# Patient Record
Sex: Female | Born: 1940 | Race: White | Hispanic: No | Marital: Married | Smoking: Never smoker
Health system: Southern US, Community
[De-identification: ages and names within clinical notes are randomized; demographics above are authoritative.]

## PROBLEM LIST (undated history)

## (undated) DIAGNOSIS — Z85828 Personal history of other malignant neoplasm of skin: Secondary | ICD-10-CM

## (undated) HISTORY — PX: NOSE SURGERY: SHX723

## (undated) HISTORY — PX: SKIN BIOPSY: SHX1

## (undated) HISTORY — PX: DILATION AND CURETTAGE OF UTERUS: SHX78

---

## 2010-10-29 ENCOUNTER — Ambulatory Visit: Payer: Self-pay | Admitting: Internal Medicine

## 2010-11-03 ENCOUNTER — Ambulatory Visit: Payer: Self-pay

## 2010-11-19 ENCOUNTER — Ambulatory Visit: Payer: Self-pay

## 2014-06-30 ENCOUNTER — Ambulatory Visit: Payer: Medicare Other

## 2014-06-30 ENCOUNTER — Ambulatory Visit
Admission: EM | Admit: 2014-06-30 | Discharge: 2014-06-30 | Disposition: A | Discharge status: Home / Self Care | Payer: Medicare Other | Attending: Family Medicine | Admitting: Family Medicine

## 2014-06-30 DIAGNOSIS — Z79899 Other long term (current) drug therapy: Secondary | ICD-10-CM | POA: Diagnosis not present

## 2014-06-30 DIAGNOSIS — R0781 Pleurodynia: Secondary | ICD-10-CM | POA: Insufficient documentation

## 2014-06-30 DIAGNOSIS — W19XXXA Unspecified fall, initial encounter: Secondary | ICD-10-CM | POA: Diagnosis not present

## 2014-06-30 MED ORDER — HYDROCODONE-ACETAMINOPHEN 5-325 MG PO TABS: 1.0000 | ORAL_TABLET | Freq: Four times a day (QID) | ORAL | Status: AC | PRN

## 2014-06-30 NOTE — Discharge Instructions (Signed)
Chest Wall Pain  Chest wall pain is pain in or around the bones and muscles of your chest. It may take up to 6 weeks to get better. It may take longer if you must stay physically active in your work and activities.   CAUSES   Chest wall pain may happen on its own. However, it may be caused by:   A viral illness like the flu.   Injury.   Coughing.   Exercise.   Arthritis.   Fibromyalgia.   Shingles.  HOME CARE INSTRUCTIONS    Avoid overtiring physical activity. Try not to strain or perform activities that cause pain. This includes any activities using your chest or your abdominal and side muscles, especially if heavy weights are used.   Put ice on the sore area.   Put ice in a plastic bag.   Place a towel between your skin and the bag.   Leave the ice on for 15-20 minutes per hour while awake for the first 2 days.   Only take over-the-counter or prescription medicines for pain, discomfort, or fever as directed by your caregiver.  SEEK IMMEDIATE MEDICAL CARE IF:    Your pain increases, or you are very uncomfortable.   You have a fever.   Your chest pain becomes worse.   You have new, unexplained symptoms.   You have nausea or vomiting.   You feel sweaty or lightheaded.   You have a cough with phlegm (sputum), or you cough up blood.  MAKE SURE YOU:    Understand these instructions.   Will watch your condition.   Will get help right away if you are not doing well or get worse.  Document Released: 01/16/2005 Document Revised: 04/10/2011 Document Reviewed: 09/12/2010  ExitCare Patient Information 2015 [employer]. This information is not intended to replace advice given to you by your health care provider. Make sure you discuss any questions you have with your health care provider.    Fall Prevention and Home Safety  Falls cause injuries and can affect all age groups. It is possible to use preventive measures to significantly decrease the likelihood of falls. There are many simple measures which  can make your home safer and prevent falls.  OUTDOORS   Repair cracks and edges of walkways and driveways.   Remove high doorway thresholds.   Trim shrubbery on the main path into your home.   Have good outside lighting.   Clear walkways of tools, rocks, debris, and clutter.   Check that handrails are not broken and are securely fastened. Both sides of steps should have handrails.   Have leaves, snow, and ice cleared regularly.   Use sand or salt on walkways during winter months.   In the garage, clean up grease or oil spills.  BATHROOM   Install night lights.   Install grab bars by the toilet and in the tub and shower.   Use non-skid mats or decals in the tub or shower.   Place a plastic non-slip stool in the shower to sit on, if needed.   Keep floors dry and clean up all water on the floor immediately.   Remove soap buildup in the tub or shower on a regular basis.   Secure bath mats with non-slip, double-sided rug tape.   Remove throw rugs and tripping hazards from the floors.  BEDROOMS   Install night lights.   Make sure a bedside light is easy to reach.   Do not use oversized bedding.     Keep a telephone by your bedside.   Have a firm chair with side arms to use for getting dressed.   Remove throw rugs and tripping hazards from the floor.  KITCHEN   Keep handles on pots and pans turned toward the center of the stove. Use back burners when possible.   Clean up spills quickly and allow time for drying.   Avoid walking on wet floors.   Avoid hot utensils and knives.   Position shelves so they are not too high or low.   Place commonly used objects within easy reach.   If necessary, use a sturdy step stool with a grab bar when reaching.   Keep electrical cables out of the way.   Do not use floor polish or wax that makes floors slippery. If you must use wax, use non-skid floor wax.   Remove throw rugs and tripping hazards from the floor.  STAIRWAYS   Never leave objects on  stairs.   Place handrails on both sides of stairways and use them. Fix any loose handrails. Make sure handrails on both sides of the stairways are as long as the stairs.   Check carpeting to make sure it is firmly attached along stairs. Make repairs to worn or loose carpet promptly.   Avoid placing throw rugs at the top or bottom of stairways, or properly secure the rug with carpet tape to prevent slippage. Get rid of throw rugs, if possible.   Have an electrician put in a light switch at the top and bottom of the stairs.  OTHER FALL PREVENTION TIPS   Wear low-heel or rubber-soled shoes that are supportive and fit well. Wear closed toe shoes.   When using a stepladder, make sure it is fully opened and both spreaders are firmly locked. Do not climb a closed stepladder.   Add color or contrast paint or tape to grab bars and handrails in your home. Place contrasting color strips on first and last steps.   Learn and use mobility aids as needed. Install an electrical emergency response system.   Turn on lights to avoid dark areas. Replace light bulbs that burn out immediately. Get light switches that glow.   Arrange furniture to create clear pathways. Keep furniture in the same place.   Firmly attach carpet with non-skid or double-sided tape.   Eliminate uneven floor surfaces.   Select a carpet pattern that does not visually hide the edge of steps.   Be aware of all pets.  OTHER HOME SAFETY TIPS   Set the water temperature for 120 F (48.8 C).   Keep emergency numbers on or near the telephone.   Keep smoke detectors on every level of the home and near sleeping areas.  Document Released: 01/06/2002 Document Revised: 07/18/2011 Document Reviewed: 04/07/2011  ExitCare Patient Information 2015 [employer]. This information is not intended to replace advice given to you by your health care provider. Make sure you discuss any questions you have with your health care provider.

## 2014-06-30 NOTE — ED Notes (Signed)
Slipped and fell 3-4 stairs down, landed midway on right side, complaints of right rib pain. No LOC.

## 2014-06-30 NOTE — ED Provider Notes (Signed)
CSN: [id number]     Arrival date & time 06/30/14  1529 History   First MD Initiated Contact with Patient 06/30/14 1625     Chief Complaint  Patient presents with  . Fall   (Consider location/radiation/quality/duration/timing/severity/associated sxs/prior Treatment) HPI 74 year old female presents complaining of pain in the right rib cage. This morning she was walking down the stairs when her feet slipped out from under her and she impacted the stairs with her right rib cage. Since then she has pain in this area that is worse with any movement. It is also slightly exacerbated by deep breathing. She denies any shortness of breath, NVD, numbness, or weakness. No systemic symptoms. No other injury. No history of osteoporosis or osteopenia   No past medical history on file. Past Surgical History  Procedure Laterality Date  . Cesarean section    . Dilation and curettage of uterus    . Nose surgery     No family history on file. History  Substance Use Topics  . Smoking status: Never Smoker   . Smokeless tobacco: Not on file  . Alcohol Use: No   OB History    No data available     Review of Systems  Respiratory: Negative for shortness of breath.   Cardiovascular: Positive for chest pain (right side of rib cage).  All other systems reviewed and are negative.   Allergies  Review of patient's allergies indicates no known allergies.  Home Medications   Prior to Admission medications   Medication Sig Start Date End Date Taking? Authorizing Provider  HYDROcodone-acetaminophen (NORCO/VICODIN) 5-325 MG per tablet Take 1 tablet by mouth every 6 (six) hours as needed. 06/30/14   Adrian BlackwaterZachary H Vedika Dumlao, PA-C   BP 119/71 mmHg  Pulse 56  Temp(Src) 97.7 F (36.5 C) (Oral)  Resp 16  Ht 4\' 11"  (1.499 m)  Wt 110 lb (49.896 kg)  BMI 22.21 kg/m2  SpO2 96% Physical Exam  Constitutional: She is oriented to person, place, and time. Vital signs are normal. She appears well-developed and well-nourished.  No distress.  HENT:  Head: Normocephalic and atraumatic.  Neck: Normal range of motion. Neck supple. No JVD present. No tracheal deviation present.  Cardiovascular: Normal rate, regular rhythm and normal heart sounds.   Pulmonary/Chest: Effort normal and breath sounds normal. No respiratory distress. She exhibits tenderness (right lateral rib cage, lower ribs, anterior axillary line).  Abdominal: Soft. She exhibits no distension and no mass. There is no tenderness. There is no rebound and no guarding.  No hepatosplenomegaly  Musculoskeletal:       Cervical back: Normal.       Thoracic back: Normal.       Lumbar back: Normal.  Neurological: She is alert and oriented to person, place, and time. She has normal strength. Coordination normal.  Skin: Skin is warm and dry. No rash noted. She is not diaphoretic.  Psychiatric: She has a normal mood and affect. Judgment normal.  Nursing note and vitals reviewed.   ED Course  Procedures (including critical care time) Labs Review Labs Reviewed - No data to display  Imaging Review Dg Ribs Unilateral W/chest Right  06/30/2014   CLINICAL DATA:  74 year old female with a history of fall down stairs this morning. Right anterior lower rib pain.  EXAM: RIGHT RIBS AND CHEST - 3+ VIEW  COMPARISON:  None.  FINDINGS: Cardiomediastinal silhouette within normal limits.  No confluent airspace disease, pneumothorax, or pleural effusion.  Questionable irregularity of the posterior ninth rib. No  displaced fracture involving the anterior ribs in the region of the fiducial.  IMPRESSION: No radiographic evidence of acute cardiopulmonary disease.  Questionable nondisplaced fracture of the posterior aspect of the right ninth rib. Correlation with point tenderness may be useful.  No displaced fracture of anterior lower ribs in the region of the fiducial.  Signed,  Yvone Neu. Loreta Ave, DO  [facility]  [facility]   Electronically  Signed   By: Gilmer Mor D.O.   On: 06/30/2014 16:58     MDM   1. Fall, initial encounter   2. Rib pain on right side    Possible nondisplaced rib fracture. We discussed symptomatic management. Follow-up when necessary   Meds ordered this encounter  Medications  . HYDROcodone-acetaminophen (NORCO/VICODIN) 5-325 MG per tablet    Sig: Take 1 tablet by mouth every 6 (six) hours as needed.    Dispense:  10 tablet    Refill:  0       Graylon Good, PA-C 06/30/14 1728

## 2014-09-22 ENCOUNTER — Ambulatory Visit
Admission: EM | Admit: 2014-09-22 | Discharge: 2014-09-22 | Disposition: A | Discharge status: Home / Self Care | Payer: Medicare Other | Attending: Family Medicine | Admitting: Family Medicine

## 2014-09-22 ENCOUNTER — Encounter: Payer: Self-pay | Admitting: Emergency Medicine

## 2014-09-22 DIAGNOSIS — L237 Allergic contact dermatitis due to plants, except food: Secondary | ICD-10-CM | POA: Diagnosis not present

## 2014-09-22 HISTORY — DX: Personal history of other malignant neoplasm of skin: Z85.828

## 2014-09-22 MED ORDER — PREDNISONE 20 MG PO TABS: ORAL_TABLET | ORAL | Status: AC

## 2014-09-22 NOTE — ED Provider Notes (Signed)
CSN: [id number]     Arrival date & time 09/22/14  1127 History   First MD Initiated Contact with Patient 09/22/14 1149     Chief Complaint  Patient presents with  . Rash   (Consider location/radiation/quality/duration/timing/severity/associated sxs/prior Treatment) HPI   This 74 year old female who presents with a disseminated rash likely from poison ivy that she contacted while weeding her lawn. She states she's had several occurrences of the poison ivy since April and tries her best to use only natural medicine but this time has been much more extensive involving her body instead of just spot areas. She has been itching causing excoriations and between her fingers some macerated areas. Several areas coated with over-the-counter medications.  Past Medical History  Diagnosis Date  . History of basal cell cancer    Past Surgical History  Procedure Laterality Date  . Cesarean section    . Dilation and curettage of uterus    . Nose surgery    . Cesarean section    . Skin biopsy     Family History  Problem Relation Age of Onset  . Hypertension Mother   . Hypothyroidism Mother   . Alcoholism Father    Social History  Substance Use Topics  . Smoking status: Never Smoker   . Smokeless tobacco: None  . Alcohol Use: No   OB History    No data available     Review of Systems  Constitutional: Negative for chills.  Skin: Positive for rash.  All other systems reviewed and are negative.   Allergies  Review of patient's allergies indicates no known allergies.  Home Medications   Prior to Admission medications   Medication Sig Start Date End Date Taking? Authorizing Provider  HYDROcodone-acetaminophen (NORCO/VICODIN) 5-325 MG per tablet Take 1 tablet by mouth every 6 (six) hours as needed. 06/30/14   Graylon Good, PA-C  predniSONE (DELTASONE) 20 MG tablet Prednisone taper over 12 days starting with 60 mg dose. 09/22/14   Chrissie Noa Perrie Ragin, PA-C   BP 127/63 mmHg  Pulse 52   Temp(Src) 98.3 F (36.8 C) (Oral)  Resp 16  Ht  (1.499 m)  Wt 120 lb (54.432 kg)  BMI 24.22 kg/m2  SpO2 99% Physical Exam  Constitutional: She appears well-developed and well-nourished.  HENT:  Head: Normocephalic and atraumatic.  Eyes: Pupils are equal, round, and reactive to light.  Skin:  Examination of the rash areas shows linear rashes with erythematous based vesicles on the back of her neck and side of her neck under her chin and larger Colace areas on her arms and legs chest and abdomen and 2 large patches on the inner aspects of her distal thighs. A smaller round clusters are seen on her distal legs. Interdigital between the left fourth and third fingers are 2 areas that are macerated and leaking.  Nursing note and vitals reviewed.   ED Course  Procedures (including critical care time) Labs Review Labs Reviewed - No data to display  Imaging Review No results found.   MDM   1. Allergic dermatitis due to poison ivy    Discharge Medication List as of 09/22/2014 12:35 PM    START taking these medications   Details  predniSONE (DELTASONE) 20 MG tablet Prednisone taper over 12 days starting with 60 mg dose., Print      Plan: 1.Diagnosis reviewed with patient 2. rx as per orders; risks, benefits, potential side effects reviewed with patient 3. Recommend supportive treatment with zyrtec, cool showers,benedryl hs  PRN 4. F/u prn if symptoms worsen or don't improve     Lutricia Feil, PA-C 09/22/14 1244

## 2014-09-22 NOTE — ED Notes (Signed)
Pt with a rash x 6 days itching

## 2014-09-22 NOTE — Discharge Instructions (Signed)

## 2016-02-18 ENCOUNTER — Encounter: Payer: Self-pay | Admitting: *Deleted

## 2016-02-18 ENCOUNTER — Ambulatory Visit: Payer: Medicare Other

## 2016-02-18 ENCOUNTER — Ambulatory Visit
Admission: EM | Admit: 2016-02-18 | Discharge: 2016-02-18 | Disposition: A | Discharge status: Home / Self Care | Payer: Medicare Other | Attending: Family Medicine | Admitting: Family Medicine

## 2016-02-18 DIAGNOSIS — R9389 Abnormal findings on diagnostic imaging of other specified body structures: Secondary | ICD-10-CM

## 2016-02-18 DIAGNOSIS — J440 Chronic obstructive pulmonary disease with acute lower respiratory infection: Secondary | ICD-10-CM | POA: Insufficient documentation

## 2016-02-18 DIAGNOSIS — J189 Pneumonia, unspecified organism: Secondary | ICD-10-CM | POA: Insufficient documentation

## 2016-02-18 DIAGNOSIS — R0981 Nasal congestion: Secondary | ICD-10-CM | POA: Diagnosis present

## 2016-02-18 DIAGNOSIS — Z85828 Personal history of other malignant neoplasm of skin: Secondary | ICD-10-CM | POA: Diagnosis not present

## 2016-02-18 DIAGNOSIS — R05 Cough: Secondary | ICD-10-CM | POA: Diagnosis present

## 2016-02-18 DIAGNOSIS — R938 Abnormal findings on diagnostic imaging of other specified body structures: Secondary | ICD-10-CM | POA: Diagnosis not present

## 2016-02-18 DIAGNOSIS — J181 Lobar pneumonia, unspecified organism: Secondary | ICD-10-CM | POA: Diagnosis not present

## 2016-02-18 MED ORDER — ALBUTEROL SULFATE HFA 108 (90 BASE) MCG/ACT IN AERS: 2.0000 | INHALATION_SPRAY | RESPIRATORY_TRACT | 0 refills | Status: AC | PRN

## 2016-02-18 MED ORDER — LEVOFLOXACIN 500 MG PO TABS: 500.0000 mg | ORAL_TABLET | Freq: Every day | ORAL | 0 refills | Status: AC

## 2016-02-18 MED ORDER — HYDROCOD POLST-CPM POLST ER 10-8 MG/5ML PO SUER: 5.0000 mL | Freq: Two times a day (BID) | ORAL | 0 refills | Status: AC | PRN

## 2016-02-18 NOTE — ED Triage Notes (Signed)
Patient started having symptoms of nasal congestion / drainage with cough one week ago. Patient reports a productive cough with green colored mucus. No complaints of chest congestion.

## 2016-02-18 NOTE — ED Provider Notes (Signed)
[facility]    CSN: [id number] Arrival date & time: 02/18/16  1344     History   Chief Complaint Chief Complaint  Patient presents with  . Cough  . Nasal Congestion    HPI Felicia Palmer is a 76 y.o. female.   Patient's here because of cough. She reports about 6 this month she started coughing for some chest congestion. Cannot get at the end of the night when she flew back from [place] and aspirin cough and congestion really got worse. The last 10 days the cough is progressively gotten worse she's had wheezing difficulty breathing at night and coughing throughout the night so much that she has a significant reclining chair to get some sleep and rest. She states coughs yellow-green and sick. She does not smoke she does not like using medication when is not needed. She has a history of basal cell carcinoma. Other than female surgery such as D&C and C-sections no surgery she's had his nose surgery and skin biopsy area no known drug allergies she never smoked. Mother had hypertension hypothyroidism father had alcoholism.   The history is provided by the patient. No language interpreter was used.  Cough  Cough characteristics:  Productive and croupy Sputum characteristics:  Yellow and green Severity:  Severe Onset quality:  Gradual Timing:  Constant Progression:  Worsening Chronicity:  New Smoker: no   Context: weather changes   Relieved by:  Nothing Worsened by:  Nothing Ineffective treatments:  None tried Associated symptoms: fever and wheezing   Associated symptoms: no shortness of breath     Past Medical History:  Diagnosis Date  . History of basal cell cancer     There are no active problems to display for this patient.   Past Surgical History:  Procedure Laterality Date  . CESAREAN SECTION    . CESAREAN SECTION    . DILATION AND CURETTAGE OF UTERUS    . NOSE SURGERY    . SKIN BIOPSY      OB History    No data available       Home  Medications    Prior to Admission medications   Medication Sig Start Date End Date Taking? Authorizing Provider  albuterol (PROVENTIL HFA;VENTOLIN HFA) 108 (90 Base) MCG/ACT inhaler Inhale 2 puffs into the lungs every 4 (four) hours as needed for wheezing or shortness of breath. 02/18/16   Hassan Rowan, MD  chlorpheniramine-HYDROcodone [facility] PENNKINETIC ER) 10-8 MG/5ML SUER Take 5 mLs by mouth every 12 (twelve) hours as needed for cough. 02/18/16   Hassan Rowan, MD  HYDROcodone-acetaminophen (NORCO/VICODIN) 5-325 MG per tablet Take 1 tablet by mouth every 6 (six) hours as needed. 06/30/14   Graylon Good, PA-C  levofloxacin (LEVAQUIN) 500 MG tablet Take 1 tablet (500 mg total) by mouth daily. 02/18/16   Hassan Rowan, MD  predniSONE (DELTASONE) 20 MG tablet Prednisone taper over 12 days starting with 60 mg dose. 09/22/14   Lutricia Feil, PA-C    Family History Family History  Problem Relation Age of Onset  . Hypertension Mother   . Hypothyroidism Mother   . Alcoholism Father     Social History Social History  Substance Use Topics  . Smoking status: Never Smoker  . Smokeless tobacco: Never Used  . Alcohol use No     Allergies   Patient has no known allergies.   Review of Systems Review of Systems  Constitutional: Positive for fever.  Respiratory: Positive for cough and wheezing. Negative  for shortness of breath.   All other systems reviewed and are negative.    Physical Exam Triage Vital Signs ED Triage Vitals  Enc Vitals Group     BP 02/18/16 1425 130/72     Pulse Rate 02/18/16 1425 83     Resp 02/18/16 1425 16     Temp 02/18/16 1425 97.9 F (36.6 C)     Temp Source 02/18/16 1425 Oral     SpO2 02/18/16 1425 100 %     Weight 02/18/16 1427 110 lb (49.9 kg)     Height 02/18/16 1427 4\' 10"  (1.473 m)     Head Circumference --      Peak Flow --      Pain Score 02/18/16 1433 0     Pain Loc --      Pain Edu? --      Excl. in GC? --    No data found.   Updated  Vital Signs BP 130/72 (BP Location: Left Arm)   Pulse 83   Temp 97.9 F (36.6 C) (Oral)   Resp 16   Ht 4\' 10"  (1.473 m)   Wt 110 lb (49.9 kg)   SpO2 100%   BMI 22.99 kg/m   Visual Acuity Right Eye Distance:   Left Eye Distance:   Bilateral Distance:    Right Eye Near:   Left Eye Near:    Bilateral Near:     Physical Exam  Constitutional: She appears well-developed and well-nourished.  HENT:  Head: Normocephalic and atraumatic.  Right Ear: External ear normal.  Left Ear: External ear normal.  Nose: Nose normal.  Mouth/Throat: Uvula is midline. No uvula swelling. Posterior oropharyngeal erythema present.  Eyes: Conjunctivae are normal.  Neck: Normal range of motion. Neck supple.  Cardiovascular: Normal rate and regular rhythm.   Pulmonary/Chest: Effort normal and breath sounds normal.  Musculoskeletal: Normal range of motion.  Lymphadenopathy:    She has cervical adenopathy.  Neurological: She is alert.  Skin: Skin is warm.  Psychiatric: She has a normal mood and affect.  Vitals reviewed.    UC Treatments / Results  Labs (all labs ordered are listed, but only abnormal results are displayed) Labs Reviewed - No data to display  EKG  EKG Interpretation None       Radiology Dg Chest 2 View  Result Date: 02/18/2016 CLINICAL DATA:  Cough and fever. EXAM: CHEST  2 VIEW COMPARISON:  06/30/2014. FINDINGS: Mediastinum and hilar structures are normal. Density is noted over the anterior chest on lateral view. This is most likely the right middle lobe. This could represent a focal area of infiltrate. Close follow-up chest x-ray recommended demonstrate clearing to exclude mass lesion. Mild left base pleuroparenchymal thickening consistent with scarring. No pneumothorax. Heart size normal. No acute bony abnormality. IMPRESSION: 1. Density is noted over the anterior lungs on lateral view. This could represent a focal area of pneumonia. A mass lesion cannot be excluded.  Follow-up PA lateral chest x-ray recommended to demonstrate clearing. 2. COPD. Left base pleural-parenchymal thickening again noted consistent with scarring. These results will be called to the ordering clinician or representative by the Radiologist Assistant, and communication documented in the PACS or zVision Dashboard. Electronically Signed   By: Maisie Fus  Register   On: 02/18/2016 16:06    Procedures Procedures (including critical care time)  Medications Ordered in UC Medications - No data to display   Initial Impression / Assessment and Plan / UC Course  I have reviewed the triage  vital signs and the nursing notes.  Pertinent labs & imaging results that were available during my care of the patient were reviewed by me and considered in my medical decision making (see chart for details).     Since her cough is been worsening and also connected with a fever and she reports marked gel malaise and myalgias well including back pain from the coughing will obtain a chest x-ray. I've explained to her the chest x-rays positive, recommend Levaquin even though does have a history of tendon damage because I think that'll be a better alternative than Zithromax and a cephalosporin. If the section is negative I'll place her on Zithromax 100 mg for 5 days. I'm going to also add Tussionex 1/2 teaspoon 1 teaspoon twice a day and an albuterol inhaler to use when necessary to minimize exposure to medication per her request follow-up for PCP in 1-2 weeks not better.  Final Clinical Impressions(s) / UC Diagnoses   Final diagnoses:  Community acquired pneumonia of right middle lobe of lung (HCC)  Abnormal chest xray      New Prescriptions New Prescriptions   ALBUTEROL (PROVENTIL HFA;VENTOLIN HFA) 108 (90 BASE) MCG/ACT INHALER    Inhale 2 puffs into the lungs every 4 (four) hours as needed for wheezing or shortness of breath.   CHLORPHENIRAMINE-HYDROCODONE (TUSSIONEX PENNKINETIC ER) 10-8 MG/5ML SUER    Take  5 mLs by mouth every 12 (twelve) hours as needed for cough.   LEVOFLOXACIN (LEVAQUIN) 500 MG TABLET    Take 1 tablet (500 mg total) by mouth daily.    Patient was informed that not only she has what appears be attributed to acquired pneumonia radiologist was concerned this may be a nodule right middle normal. Explained to her that she is in need to have repeat chest x-ray in 3-4 weeks for confirmation of clearing of the pneumonia and difficult Levaquin Tussionex and albuterol  Note: This dictation was prepared with Dragon dictation along with smaller phrase technology. Any transcriptional errors that result from this process are unintentional.   Hassan RowanEugene Jalecia Leon, MD 02/18/16 2259

## 2018-05-19 IMAGING — CR DG CHEST 2V
2 series · 2 of 2 positions shown · non-contrast
Comparison: 06/30/2014.

CLINICAL DATA: Cough and fever.

EXAM:
CHEST  2 VIEW

[chest lat]
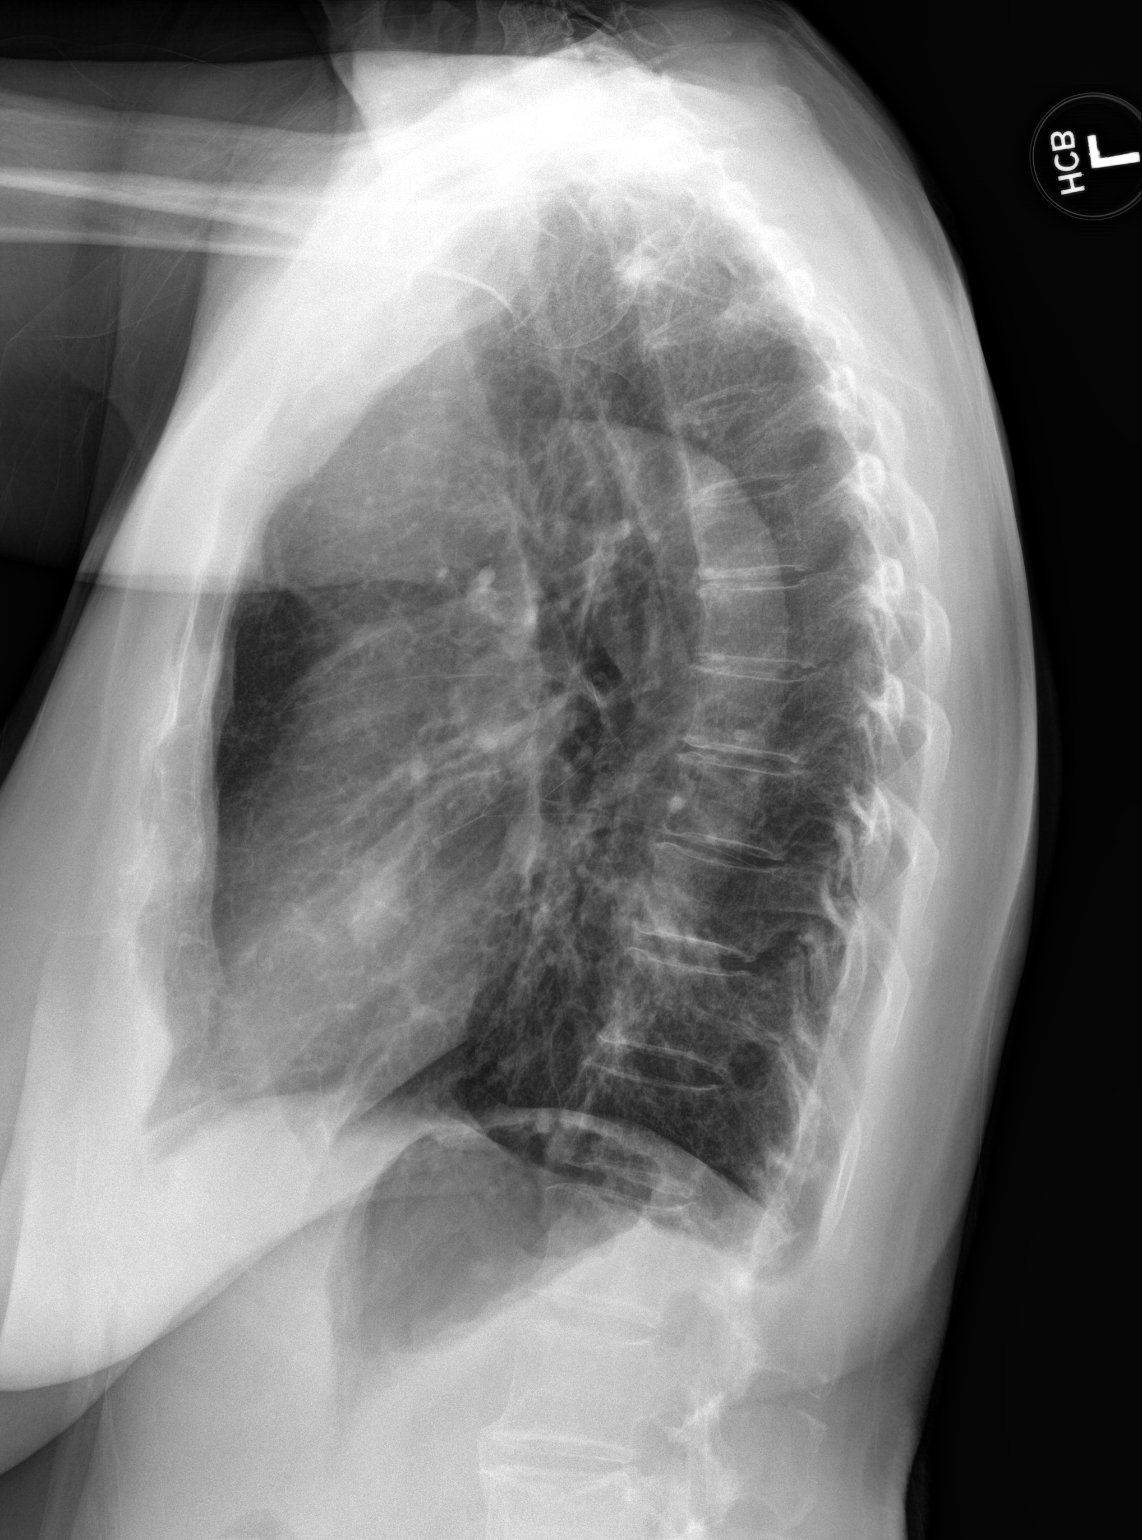

[chest pa]
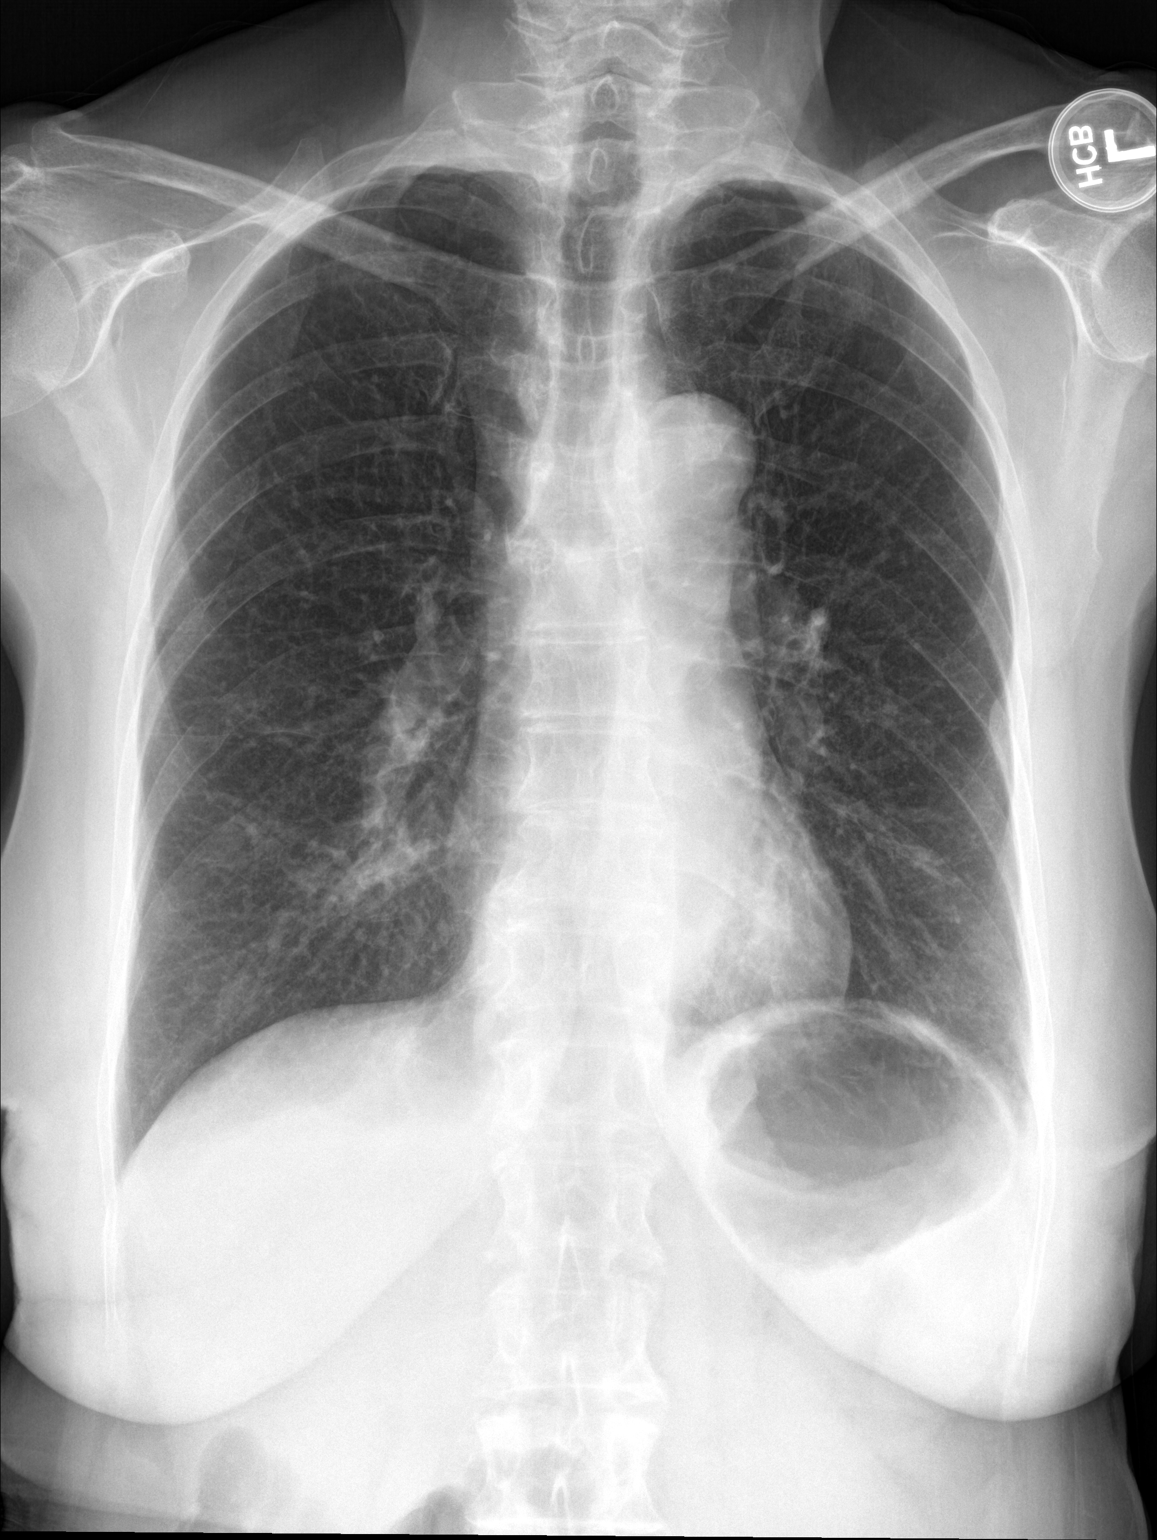

[2 of 2 positions shown; findings below may reference images not displayed]

FINDINGS: Mediastinum and hilar structures are normal. Density is noted over
the anterior chest on lateral view. This is most likely the right
middle lobe. This could represent a focal area of infiltrate. Close
follow-up chest x-ray recommended demonstrate clearing to exclude
mass lesion. Mild left base pleuroparenchymal thickening consistent
with scarring. No pneumothorax. Heart size normal. No acute bony
abnormality.
IMPRESSION: 1. Density is noted over the anterior lungs on lateral view. This
could represent a focal area of pneumonia. A mass lesion cannot be
excluded. Follow-up PA lateral chest x-ray recommended to
demonstrate clearing.

2. COPD. Left base pleural-parenchymal thickening again noted
consistent with scarring.

These results will be called to the ordering clinician or
representative by the Radiologist Assistant, and communication
documented in the PACS or zVision Dashboard.

## 2023-06-14 ENCOUNTER — Other Ambulatory Visit: Payer: Self-pay | Admitting: Family Medicine

## 2023-06-14 ENCOUNTER — Ambulatory Visit
Admission: RE | Admit: 2023-06-14 | Discharge: 2023-06-14 | Disposition: A | Discharge status: Home / Self Care | Source: Ambulatory Visit | Attending: Family Medicine | Admitting: Family Medicine

## 2023-06-14 ENCOUNTER — Ambulatory Visit
Admission: RE | Admit: 2023-06-14 | Discharge: 2023-06-14 | Disposition: A | Discharge status: Home / Self Care | Attending: Family Medicine | Admitting: Family Medicine

## 2023-06-14 DIAGNOSIS — S20212A Contusion of left front wall of thorax, initial encounter: Secondary | ICD-10-CM
